# Patient Record
Sex: Female | Born: 1970 | Race: White | Hispanic: No | Marital: Married | Smoking: Never smoker
Health system: Southern US, Community
[De-identification: ages and names within clinical notes are randomized; demographics above are authoritative.]

---

## 2007-06-07 ENCOUNTER — Ambulatory Visit: Payer: Self-pay | Admitting: Pediatrics

## 2011-10-14 DIAGNOSIS — C419 Malignant neoplasm of bone and articular cartilage, unspecified: Secondary | ICD-10-CM | POA: Insufficient documentation

## 2011-10-14 DIAGNOSIS — N189 Chronic kidney disease, unspecified: Secondary | ICD-10-CM | POA: Insufficient documentation

## 2014-07-08 DIAGNOSIS — B009 Herpesviral infection, unspecified: Secondary | ICD-10-CM | POA: Insufficient documentation

## 2014-07-08 DIAGNOSIS — Z0000 Encounter for general adult medical examination without abnormal findings: Secondary | ICD-10-CM | POA: Insufficient documentation

## 2016-03-22 DIAGNOSIS — R29898 Other symptoms and signs involving the musculoskeletal system: Secondary | ICD-10-CM | POA: Insufficient documentation

## 2019-01-09 DIAGNOSIS — F4329 Adjustment disorder with other symptoms: Secondary | ICD-10-CM | POA: Insufficient documentation

## 2019-01-09 DIAGNOSIS — N951 Menopausal and female climacteric states: Secondary | ICD-10-CM | POA: Insufficient documentation

## 2019-04-13 DIAGNOSIS — E876 Hypokalemia: Secondary | ICD-10-CM | POA: Insufficient documentation

## 2019-04-13 DIAGNOSIS — N1832 Chronic kidney disease, stage 3b: Secondary | ICD-10-CM | POA: Insufficient documentation

## 2019-04-13 DIAGNOSIS — E612 Magnesium deficiency: Secondary | ICD-10-CM | POA: Insufficient documentation

## 2020-02-09 ENCOUNTER — Ambulatory Visit: Payer: Self-pay | Attending: Internal Medicine

## 2020-02-09 DIAGNOSIS — Z23 Encounter for immunization: Secondary | ICD-10-CM

## 2020-02-09 NOTE — Progress Notes (Signed)
   Covid-19 Vaccination Clinic  Name:  Candace Ray    MRN: [id number] DOB: 1971/07/08  02/09/2020  Candace Ray was observed post Covid-19 immunization for 15 minutes without incidence. She was provided with Vaccine Information Sheet and instruction to access the V-Safe system.   Candace Ray was instructed to call 911 with any severe reactions post vaccine: Marland Kitchen Difficulty breathing  . Swelling of your face and throat  . A fast heartbeat  . A bad rash all over your body  . Dizziness and weakness    Immunizations Administered    Name Date Dose VIS Date Route   Moderna COVID-19 Vaccine 02/09/2020 11:59 AM 0.5 mL 11/13/2019 Intramuscular   Manufacturer: Moderna   Lot: [id number]   [id number]

## 2020-03-08 ENCOUNTER — Ambulatory Visit: Payer: Self-pay | Attending: Internal Medicine

## 2020-03-08 DIAGNOSIS — Z23 Encounter for immunization: Secondary | ICD-10-CM

## 2020-03-08 NOTE — Progress Notes (Signed)
   Covid-19 Vaccination Clinic  Name:  Candace Ray    MRN: [id number] DOB: 1971-02-05  03/08/2020  [name] was observed post Covid-19 immunization for 15 minutes without incident. She was provided with Vaccine Information Sheet and instruction to access the V-Safe system.   [name] was instructed to call 911 with any severe reactions post vaccine: Marland Kitchen Difficulty breathing  . Swelling of face and throat  . A fast heartbeat  . A bad rash all over body  . Dizziness and weakness   Immunizations Administered    Name Date Dose VIS Date Route   Moderna COVID-19 Vaccine 03/08/2020 11:02 AM 0.5 mL 11/13/2019 Intramuscular   Manufacturer: Levan Hurst   LotUD:[id number]   [place]:[id number]

## 2020-11-01 ENCOUNTER — Other Ambulatory Visit: Payer: Self-pay

## 2020-11-01 ENCOUNTER — Ambulatory Visit
Admission: EM | Admit: 2020-11-01 | Discharge: 2020-11-01 | Disposition: A | Discharge status: Home / Self Care | Payer: BC Managed Care – PPO

## 2020-11-01 ENCOUNTER — Encounter: Payer: Self-pay | Admitting: Emergency Medicine

## 2020-11-01 DIAGNOSIS — H6981 Other specified disorders of Eustachian tube, right ear: Secondary | ICD-10-CM

## 2020-11-01 NOTE — ED Provider Notes (Signed)
[facility]    CSN: [id number] Arrival date & time: 11/01/20  1424      History   Chief Complaint Chief Complaint  Patient presents with   Otalgia    right   Dizziness    HPI Candace Ray is a 49 y.o. female.   HPI   49 year old female here for evaluation of right ear dull aching pain  Patient reports that she has had a dull ache in her right ear off and on for the past 2 weeks.  This morning when she woke up she had a bout of dizziness.  She says her dizziness is better when she is walking or moving through space and worse when she is sitting or laying.  Patient's also had a minor runny nose.  Patient denies fever, changes in hearing, sore throat, or discharge from her ear.  History reviewed. No pertinent past medical history.  There are no problems to display for this patient.   History reviewed. No pertinent surgical history.  OB History   No obstetric history on file.      Home Medications    Prior to Admission medications   Medication Sig Start Date End Date Taking? Authorizing Provider  LARISSIA 0.1-20 MG-MCG tablet Take 1 tablet by mouth daily. 09/25/20  Yes [provider]  losartan (COZAAR) 100 MG tablet losartan 100 mg tablet  TAKE 1 TABLET BY MOUTH EACH DAY 04/13/19  Yes [provider]  potassium chloride SA (KLOR-CON M20) 20 MEQ tablet Klor-Con M20 mEq tablet,extended release  TAKE 1 TABLET BY MOUTH DAILY. DO NOT CRUSH OR CHEW. 09/29/11  Yes [provider]    Family History History reviewed. No pertinent family history.  Social History Social History   Tobacco Use   Smoking status: Never Smoker   Smokeless tobacco: Never Used  Scientific laboratory technician Use: Never used  Substance Use Topics   Alcohol use: Yes   Drug use: Never     Allergies   Sulfa antibiotics   Review of Systems Review of Systems  Constitutional: Negative for activity change, appetite change and fever.  HENT: Positive  for ear pain and rhinorrhea. Negative for congestion, ear discharge, sinus pressure and sore throat.   Respiratory: Negative for cough and shortness of breath.   Cardiovascular: Negative for chest pain.  Gastrointestinal: Negative for nausea and vomiting.  Musculoskeletal: Negative for back pain.  Skin: Negative for rash.  Neurological: Positive for dizziness. Negative for headaches.  Hematological: Negative.   Psychiatric/Behavioral: Negative.      Physical Exam Triage Vital Signs ED Triage Vitals  Enc Vitals Group     BP 11/01/20 1445 119/87     Pulse Rate 11/01/20 1445 79     Resp 11/01/20 1445 14     Temp 11/01/20 1445 98.1 F (36.7 C)     Temp Source 11/01/20 1445 Oral     SpO2 11/01/20 1445 100 %     Weight 11/01/20 1441 140 lb (63.5 kg)     Height 11/01/20 1441 '5\' 6"'  (1.676 m)     Head Circumference --      Peak Flow --      Pain Score 11/01/20 1441 3     Pain Loc --      Pain Edu? --      Excl. in [place]? --    No data found.  Updated Vital Signs BP 119/87 (BP Location: Left Arm)    Pulse 79    Temp  98.1 F (36.7 C) (Oral)    Resp 14    Ht '5\' 6"'  (1.676 m)    Wt 140 lb (63.5 kg)    SpO2 100%    BMI 22.60 kg/m   Visual Acuity Right Eye Distance:   Left Eye Distance:   Bilateral Distance:    Right Eye Near:   Left Eye Near:    Bilateral Near:     Physical Exam Vitals and nursing note reviewed.  Constitutional:      General: She is not in acute distress.    Appearance: Normal appearance. She is not toxic-appearing.  HENT:     Head: Normocephalic and atraumatic.     Right Ear: Tympanic membrane, ear canal and external ear normal. There is no impacted cerumen.     Left Ear: Tympanic membrane, ear canal and external ear normal. There is no impacted cerumen.     Nose: Congestion and rhinorrhea present.     Comments: The mucosa is mildly erythematous and edematous with clear nasal discharge.    Mouth/Throat:     Mouth: Mucous membranes are moist.     Pharynx:  Oropharynx is clear. No oropharyngeal exudate or posterior oropharyngeal erythema.  Eyes:     General: No scleral icterus.    Extraocular Movements: Extraocular movements intact.     Conjunctiva/sclera: Conjunctivae normal.     Pupils: Pupils are equal, round, and reactive to light.  Cardiovascular:     Rate and Rhythm: Normal rate and regular rhythm.     Pulses: Normal pulses.     Heart sounds: Normal heart sounds. No murmur heard.  No gallop.   Pulmonary:     Effort: Pulmonary effort is normal.     Breath sounds: Normal breath sounds. No wheezing, rhonchi or rales.  Musculoskeletal:        General: No swelling or tenderness. Normal range of motion.  Skin:    General: Skin is warm and dry.     Capillary Refill: Capillary refill takes less than 2 seconds.     Findings: No erythema or rash.  Neurological:     General: No focal deficit present.     Mental Status: She is alert and oriented to person, place, and time.  Psychiatric:        Mood and Affect: Mood normal.        Behavior: Behavior normal.        Thought Content: Thought content normal.        Judgment: Judgment normal.      UC Treatments / Results  Labs (all labs ordered are listed, but only abnormal results are displayed) Labs Reviewed - No data to display  EKG   Radiology No results found.  Procedures Procedures (including critical care time)  Medications Ordered in UC Medications - No data to display  Initial Impression / Assessment and Plan / UC Course  I have reviewed the triage vital signs and the nursing notes.  Pertinent labs & imaging results that were available during my care of the patient were reviewed by me and considered in my medical decision making (see chart for details).   Is here for evaluation of a dull right earache that she has had off-and-on for the past 2 weeks.  Patient denies discharge or fever from her ear.  Physical exam reveals normal tympanic membranes bilaterally.  Patient  does have tenderness to the eustachian tube on the right side.  Patient coached through equalizing her ears in the exam room  and expressed some relief of symptoms on the right.  Patient's exam consistent with eustachian tube dysfunction.  Will DC home with instructions for sinus irrigation using a NeilMed sinus rinse kit and distilled water, Flonase, and continued equalization of her tympanic membrane's.   Final Clinical Impressions(s) / UC Diagnoses   Final diagnoses:  Eustachian tube dysfunction, right     Discharge Instructions     Perform sinus irrigation with a NeilMed sinus rinse kit and distilled water twice daily.  Use Flonase, 2 squirts in each nostril at bedtime, to help with eustachian tube inflammation and cut down on mucus production.  After each pair of squirts per nostril follow-up with one squirt of nasal saline spray.  Continue to equalize her ears until her symptoms resolve.  Return or follow-up with your primary care provider with continued or worsening symptoms.    ED Prescriptions    None     PDMP not reviewed this encounter.   Margarette Canada, NP 11/01/20 1525

## 2020-11-01 NOTE — Discharge Instructions (Addendum)
Perform sinus irrigation with a NeilMed sinus rinse kit and distilled water twice daily.  Use Flonase, 2 squirts in each nostril at bedtime, to help with eustachian tube inflammation and cut down on mucus production.  After each pair of squirts per nostril follow-up with one squirt of nasal saline spray.  Continue to equalize her ears until her symptoms resolve.  Return or follow-up with your primary care provider with continued or worsening symptoms.

## 2020-11-01 NOTE — ED Triage Notes (Signed)
Patient c/o right ear pain off and on for couple of weeks.  Patient states that when she woke up this morning she had an episode of dizziness.  Patient states that has resolved.  Patient denies fevers.

## 2020-12-16 ENCOUNTER — Ambulatory Visit
Admission: EM | Admit: 2020-12-16 | Discharge: 2020-12-16 | Disposition: A | Discharge status: Home / Self Care | Payer: BC Managed Care – PPO

## 2020-12-17 ENCOUNTER — Other Ambulatory Visit: Payer: Self-pay

## 2020-12-17 ENCOUNTER — Ambulatory Visit
Admission: RE | Admit: 2020-12-17 | Discharge: 2020-12-17 | Disposition: A | Discharge status: Home / Self Care | Payer: Self-pay | Source: Ambulatory Visit | Attending: Sports Medicine | Admitting: Sports Medicine

## 2020-12-17 ENCOUNTER — Ambulatory Visit (INDEPENDENT_AMBULATORY_CARE_PROVIDER_SITE_OTHER): Payer: Self-pay

## 2020-12-17 VITALS — BP 143/100 | HR 92 | Temp 98.0°F | Resp 18 | Ht 65.0 in | Wt 140.0 lb

## 2020-12-17 DIAGNOSIS — S6981XA Other specified injuries of right wrist, hand and finger(s), initial encounter: Secondary | ICD-10-CM

## 2020-12-17 DIAGNOSIS — M25531 Pain in right wrist: Secondary | ICD-10-CM

## 2020-12-17 NOTE — ED Provider Notes (Signed)
[facility]    CSN: [id number] Arrival date & time: 12/17/20  1632      History   Chief Complaint Chief Complaint  Patient presents with  . Appointment    [phone]  . Wrist Pain    right    HPI Candace Ray is a 50 y.o. female.   Patient is a pleasant 50 year old right-hand-dominant female who presents for evaluation about 3 weeks of right wrist dorsal pain. She denies any accidents trauma or falls. It began shortly after she was doing some blowing of the leaves on her property. No swelling bruising redness. She been using Tylenol icing and using a brace with limited success. She says is not getting worse but is not really improving. She points over the distal aspect of the ulna as the point of maximal tenderness. Denies any neck pain numbness tingling. Not dropping any objects. No red flag signs or symptoms listed on history. She has never had any symptoms like this in the past. 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     History reviewed. No pertinent past medical history.  There are no problems to display for this patient.   History reviewed. No pertinent surgical history.  OB History   No obstetric history on file.      Home Medications    Prior to Admission medications   Medication Sig Start Date End Date Taking? Authorizing Provider  LARISSIA 0.1-20 MG-MCG tablet Take 1 tablet by mouth daily. 09/25/20  Yes [provider]  losartan (COZAAR) 100 MG tablet losartan 100 mg tablet  TAKE 1 TABLET BY MOUTH EACH DAY 04/13/19  Yes [provider]  potassium chloride SA (KLOR-CON) 20 MEQ tablet Klor-Con M20 mEq tablet,extended release  TAKE 1 TABLET BY MOUTH DAILY. DO NOT CRUSH OR CHEW. 09/29/11  Yes [provider]    Family History Family History  Problem Relation Age of Onset  . Healthy Mother     Social History Social History   Tobacco Use  . Smoking status: Never Smoker  . Smokeless tobacco:  Never Used  Vaping Use  . Vaping Use: Never used  Substance Use Topics  . Alcohol use: Yes  . Drug use: Never     Allergies   Sulfa antibiotics   Review of Systems Review of Systems  Constitutional: Negative.   HENT: Negative.   Eyes: Negative.   Respiratory: Negative.   Cardiovascular: Negative.   Gastrointestinal: Negative.   Genitourinary: Negative.   Musculoskeletal: Negative for neck pain and neck stiffness.       Positive for right wrist pain  Skin: Negative for color change, pallor, rash and wound.  Neurological: Negative for weakness and numbness.  All other systems reviewed and are negative.    Physical Exam Triage Vital Signs ED Triage Vitals  Enc Vitals Group     BP 12/17/20 1906 (!) 143/100     Pulse Rate 12/17/20 1906 92     Resp 12/17/20 1906 18     Temp 12/17/20 1906 98 F (36.7 C)     Temp Source 12/17/20 1906 Oral     SpO2 12/17/20 1906 100 %     Weight 12/17/20 1754 140 lb (63.5 kg)     Height 12/17/20 1754 5\' 5"  (1.651 m)     Head Circumference --      Peak Flow --      Pain Score 12/17/20 1753 3     Pain Loc --  Pain Edu? --      Excl. in [place]? --    No data found.  Updated Vital Signs BP (!) 143/100 (BP Location: Right Arm)   Pulse 92   Temp 98 F (36.7 C) (Oral)   Resp 18   Ht 5\' 5"  (1.651 m)   Wt 63.5 kg   SpO2 100%   BMI 23.30 kg/m   Visual Acuity Right Eye Distance:   Left Eye Distance:   Bilateral Distance:    Right Eye Near:   Left Eye Near:    Bilateral Near:     Physical Exam Vitals and nursing note reviewed.  Constitutional:      General: She is not in acute distress.    Appearance: Normal appearance. She is not ill-appearing or toxic-appearing.  HENT:     Head: Normocephalic and atraumatic.  Musculoskeletal:     Comments: Left wrist: Normal to inspection palpation range of motion special test.  Right wrist: No obvious bony abnormality ecchymosis erythema soft tissue swelling. She is tender to palpation  over the distal aspect of the ulna just medial to the ulnar styloid. Its directly over the TFCC. She has some mild decreased range of motion with ulnar radial deviation that localizes her pain in that area. Also some mild decreased range of motion with wrist extension. Grip strength is well-preserved. I do not appreciate any clicking throughout circumduction of the wrist. She has no real tenderness over the wrist joint itself. No tenderness in the snuffbox. Normal sensation 2+ pulses distally.  Neurological:     General: No focal deficit present.     Mental Status: She is alert and oriented to person, place, and time.      UC Treatments / Results  Labs (all labs ordered are listed, but only abnormal results are displayed) Labs Reviewed - No data to display  EKG   Radiology DG Wrist Complete Right  Result Date: 12/17/2020 CLINICAL DATA:  Wrist pain for 3 weeks EXAM: RIGHT WRIST - COMPLETE 3+ VIEW COMPARISON:  None. FINDINGS: There is no evidence of fracture or dislocation. There is no evidence of arthropathy or other focal bone abnormality. Soft tissues are unremarkable. IMPRESSION: Negative. Electronically Signed   By: 02/14/2021 M.D.   On: 12/17/2020 19:18    Procedures Procedures (including critical care time)  Medications Ordered in UC Medications - No data to display  Initial Impression / Assessment and Plan / UC Course  I have reviewed the triage vital signs and the nursing notes.  Pertinent labs & imaging results that were available during my care of the patient were reviewed by me and considered in my medical decision making (see chart for details).   Clinical impression: Right wrist pain localized over the distal ulna. Is consistent with a TFCC injury versus irritation from using her leaf blower. Certainly could have a tendinitis but seems more consistent with TFCC involvement.  Treatment plan: 1. The findings and treatment plan were discussed in detail with the  patient. Patient was in agreement. 2. X-rays were negative for any acute osseous findings. 3. Educational handout on TFCC injuries was provided. 4. Continue with the brace intermittently, icing and over-the-counter anti-inflammatories. 5. Recommended that she see a wrist specialist. She wanted to go to [facility]. We will give her the number for orthopedics in [facility]. 6. Supportive care, and were happy to see her back on an as-needed basis.     Final Clinical Impressions(s) / UC Diagnoses   Final  diagnoses:  Right wrist pain  TFCC (triangular fibrocartilage complex) injury, right, initial encounter     Discharge Instructions     X-rays were negative for any acute osseous findings. Educational handout on TFCC injuries was provided. Continue with the brace intermittently, icing and over-the-counter anti-inflammatories. Recommended that she see a wrist specialist. She wanted to go to [facility]. We will give her the number for orthopedics in [facility]. Supportive care, and were happy to see her back on an as-needed basis.    ED Prescriptions    None     PDMP not reviewed this encounter.   Verda Cumins, MD 12/17/20 2028

## 2020-12-17 NOTE — ED Triage Notes (Signed)
Patient complains of right wrist pain that started around 3 weeks ago. Patient states that this started after blowing leaves.

## 2020-12-17 NOTE — Discharge Instructions (Addendum)
X-rays were negative for any acute osseous findings. Educational handout on TFCC injuries was provided. Continue with the brace intermittently, icing and over-the-counter anti-inflammatories. Recommended that she see a wrist specialist. She wanted to go to [facility]. We will give her the number for orthopedics in [facility]. Supportive care, and were happy to see her back on an as-needed basis.

## 2021-07-18 ENCOUNTER — Encounter: Payer: Self-pay | Admitting: Emergency Medicine

## 2021-07-18 ENCOUNTER — Ambulatory Visit (INDEPENDENT_AMBULATORY_CARE_PROVIDER_SITE_OTHER): Payer: BC Managed Care – PPO

## 2021-07-18 ENCOUNTER — Ambulatory Visit
Admission: EM | Admit: 2021-07-18 | Discharge: 2021-07-18 | Disposition: A | Discharge status: Home / Self Care | Payer: BC Managed Care – PPO | Attending: Physician Assistant | Admitting: Physician Assistant

## 2021-07-18 DIAGNOSIS — M25572 Pain in left ankle and joints of left foot: Secondary | ICD-10-CM | POA: Diagnosis not present

## 2021-07-18 DIAGNOSIS — M79672 Pain in left foot: Secondary | ICD-10-CM

## 2021-07-18 DIAGNOSIS — S9032XA Contusion of left foot, initial encounter: Secondary | ICD-10-CM | POA: Diagnosis not present

## 2021-07-18 MED ORDER — IBUPROFEN 800 MG PO TABS: 800.0000 mg | ORAL_TABLET | Freq: Three times a day (TID) | ORAL | 0 refills | Status: AC | PRN

## 2021-07-18 NOTE — Discharge Instructions (Addendum)
FOOT PAIN: X-rays are not significant for any fractures. Stressed avoiding painful activities . Reviewed RICE guidelines. Use medications as directed, including NSAIDs. If no NSAIDs have been prescribed for you today, you may take Aleve or Motrin over the counter. May use Tylenol in between doses of NSAIDs.  If no improvement in the next 1-2 weeks, f/u with PCP or return to our office for reexamination, and please feel free to call or return at any time for any questions or concerns you may have and we will be happy to help you!

## 2021-07-18 NOTE — ED Provider Notes (Signed)
[facility]    CSN: [id number] Arrival date & time: 07/18/21  1207      History   Chief Complaint Chief Complaint  Patient presents with   Ankle Pain    HPI Candace Ray is a 50 y.o. female presenting for left ankle and foot pain and swelling as well as bruising following an injury she sustained 2 days ago.  Patient states that she fell over a tree root and believes she twisted her ankle.  She says she has been trying to elevate the extremity and ice the area and is also taken ibuprofen and Tylenol for discomfort.  She says it does not hurt too badly but she is concerned about swelling and bruising.  She has a history of bone cancer in the affected extremity and states she already has diminished feeling in this foot and foot drop.  Denies any change in her baseline in regards to the paresthesias or weakness.  No other injury sustained.  No other complaints.  HPI  History reviewed. No pertinent past medical history.  There are no problems to display for this patient.   History reviewed. No pertinent surgical history.  OB History   No obstetric history on file.      Home Medications    Prior to Admission medications   Medication Sig Start Date End Date Taking? Authorizing Provider  ibuprofen (ADVIL) 800 MG tablet Take 1 tablet (800 mg total) by mouth every 8 (eight) hours as needed for up to 7 days. 07/18/21 07/25/21 Yes Laurene Footman B, PA-C  LARISSIA 0.1-20 MG-MCG tablet Take 1 tablet by mouth daily. 09/25/20  Yes [provider]  losartan (COZAAR) 100 MG tablet losartan 100 mg tablet  TAKE 1 TABLET BY MOUTH EACH DAY 04/13/19  Yes [provider]  potassium chloride SA (KLOR-CON) 20 MEQ tablet Klor-Con M20 mEq tablet,extended release  TAKE 1 TABLET BY MOUTH DAILY. DO NOT CRUSH OR CHEW. 09/29/11  Yes [provider]    Family History Family History  Problem Relation Age of Onset   Healthy Mother     Social History Social  History   Tobacco Use   Smoking status: Never   Smokeless tobacco: Never  Vaping Use   Vaping Use: Never used  Substance Use Topics   Alcohol use: Yes   Drug use: Never     Allergies   Sulfa antibiotics   Review of Systems Review of Systems  Musculoskeletal:  Positive for arthralgias and joint swelling. Negative for gait problem.  Skin:  Positive for color change. Negative for wound.  Neurological:  Positive for numbness. Negative for weakness.    Physical Exam Triage Vital Signs ED Triage Vitals  Enc Vitals Group     BP 07/18/21 1251 126/89     Pulse Rate 07/18/21 1251 (!) 105     Resp 07/18/21 1251 16     Temp 07/18/21 1251 98.1 F (36.7 C)     Temp Source 07/18/21 1251 Oral     SpO2 07/18/21 1251 98 %     Weight --      Height --      Head Circumference --      Peak Flow --      Pain Score 07/18/21 1250 1     Pain Loc --      Pain Edu? --      Excl. in [place]? --    No data found.  Updated Vital Signs BP 126/89 (BP Location: Left Arm)  Pulse (!) 105   Temp 98.1 F (36.7 C) (Oral)   Resp 16   SpO2 98%       Physical Exam Vitals and nursing note reviewed.  Constitutional:      General: She is not in acute distress.    Appearance: Normal appearance. She is not ill-appearing or toxic-appearing.  HENT:     Head: Normocephalic and atraumatic.  Eyes:     General: No scleral icterus.       Right eye: No discharge.        Left eye: No discharge.     Conjunctiva/sclera: Conjunctivae normal.  Cardiovascular:     Rate and Rhythm: Normal rate and regular rhythm.     Pulses: Normal pulses.  Pulmonary:     Effort: Pulmonary effort is normal. No respiratory distress.  Musculoskeletal:     Cervical back: Neck supple.     Left ankle: Swelling present. Tenderness present over the lateral malleolus, medial malleolus and ATF ligament. Decreased range of motion.     Left foot: Swelling and tenderness (TTP over 3rd-5th metatarsals) present. Normal pulse.      Comments: Significant swelling and ecchymosis of foot and ankle  Skin:    General: Skin is dry.  Neurological:     General: No focal deficit present.     Mental Status: She is alert. Mental status is at baseline.     Motor: No weakness.     Gait: Gait abnormal.  Psychiatric:        Mood and Affect: Mood normal.        Behavior: Behavior normal.        Thought Content: Thought content normal.     UC Treatments / Results  Labs (all labs ordered are listed, but only abnormal results are displayed) Labs Reviewed - No data to display  EKG   Radiology DG Ankle Complete Left  Result Date: 07/18/2021 CLINICAL DATA:  Fall, left ankle pain EXAM: LEFT ANKLE COMPLETE - 3+ VIEW COMPARISON:  None. FINDINGS: Three view radiograph left ankle demonstrates normal alignment. No fracture or dislocation. Ankle mortise is aligned. No effusion. Mild soft tissue swelling noted superficial to the lateral malleolus. IMPRESSION: Lateral soft tissue swelling.  No fracture or dislocation. Electronically Signed   By: Fidela Salisbury MD   On: 07/18/2021 13:25   DG Foot Complete Left  Result Date: 07/18/2021 CLINICAL DATA:  Fall, left foot pain EXAM: LEFT FOOT - COMPLETE 3+ VIEW COMPARISON:  None. FINDINGS: Normal alignment. No fracture or dislocation. Advanced degenerate arthritis of the first MTP joint with large subchondral cyst or possible surgical defect within the head of the first metatarsal. Remaining joint spaces are preserved. Moderate soft tissue swelling of the left forefoot. IMPRESSION: Soft tissue swelling of the left forefoot. No fracture or dislocation. 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 Electronically Signed   By: Fidela Salisbury MD   On: 07/18/2021 13:27    Procedures Procedures (including critical care time)  Medications Ordered in UC Medications - No data to display  Initial Impression /  Assessment and Plan / UC Course  I have reviewed the triage vital signs and the nursing notes.  Pertinent labs & imaging results that were available during my care of the patient were reviewed by me and considered in my medical decision making (see chart for details).  50 year old female presenting for left foot/ankle pain, swelling and bruising.  X-ray  of foot and ankle obtained today and independently viewed by me.  X-rays negative for any fractures.  X-ray does show advanced degenerative arthritis of the left first MTP joint and postsurgical changes of the left first metatarsal head.  Reviewed results with patient.  Advised her that her x-rays are clear for any fractures.  Advised her that the pain is due to the swelling and bruising.  Reviewed RICE guidelines.  Sent in ibuprofen 800 mg tablets.  Also placed patient in lace up ankle brace.  Advised her to follow-up with PCP or Ortho as needed if not improving over the next 7 to 10 days or symptoms worsen.  Final Clinical Impressions(s) / UC Diagnoses   Final diagnoses:  Acute left ankle pain  Left foot pain  Contusion of left foot, initial encounter     Discharge Instructions      FOOT PAIN: X-rays are not significant for any fractures. Stressed avoiding painful activities . Reviewed RICE guidelines. Use medications as directed, including NSAIDs. If no NSAIDs have been prescribed for you today, you may take Aleve or Motrin over the counter. May use Tylenol in between doses of NSAIDs.  If no improvement in the next 1-2 weeks, f/u with PCP or return to our office for reexamination, and please feel free to call or return at any time for any questions or concerns you may have and we will be happy to help you!         ED Prescriptions     Medication Sig Dispense Auth. Provider   ibuprofen (ADVIL) 800 MG tablet Take 1 tablet (800 mg total) by mouth every 8 (eight) hours as needed for up to 7 days. 21 tablet Danton Clap, PA-C       PDMP not reviewed this encounter.   Danton Clap, PA-C 07/18/21 1357

## 2021-07-18 NOTE — ED Triage Notes (Signed)
Pt c/o fall over a tree root. She states her left ankle is swollen and is painful when bearing weight. She has been using tylenol, ice and elevating her leg as much as possible with relief. Pt states she has a hx of bone cancer in that leg.

## 2021-11-17 ENCOUNTER — Ambulatory Visit: Payer: BC Managed Care – PPO | Admitting: Podiatry

## 2021-11-17 ENCOUNTER — Other Ambulatory Visit: Payer: Self-pay

## 2021-11-17 DIAGNOSIS — M7752 Other enthesopathy of left foot: Secondary | ICD-10-CM | POA: Diagnosis not present

## 2021-11-24 ENCOUNTER — Encounter: Payer: Self-pay | Admitting: Podiatry

## 2021-11-24 NOTE — Progress Notes (Signed)
Subjective:  Patient ID: Candace Ray, female    DOB: Jan 30, 1971,  MRN: [id number]  Chief Complaint  Patient presents with   Foot Pain    50 y.o. female presents with the above complaint.  Patient presents with complaint of left third metatarsophalangeal joint pain.  Patient states it Painful has progressed to gotten worse.  Is tender to touch tender with ambulation.  She states it hurts to going barefoot floor.  She states it hurts to stand on it.  She would like to discuss treatment options she has not seen anyone as prior to see me.  Pain scale is 8 out of 10 sharp shooting with sometimes dull aching nature to it.  She has tried some over-the-counter stuff none of which has helped.  She has not made any shoe gear modification either.   Review of Systems: Negative except as noted in the HPI. Denies N/V/F/Ch.  No past medical history on file.  Current Outpatient Medications:    ibuprofen (ADVIL) 600 MG tablet, Take by mouth., Disp: , Rfl:    losartan (COZAAR) 100 MG tablet, Take by mouth., Disp: , Rfl:    meclizine (ANTIVERT) 12.5 MG tablet, Take by mouth., Disp: , Rfl:    valACYclovir (VALTREX) 500 MG tablet, valacyclovir 500 mg tablet  TAKE 1 TABLET BY MOUTH EVERY DAY - PLEASE SCHEDULE ANNUAL EXAM, Disp: , Rfl:    LARISSIA 0.1-20 MG-MCG tablet, Take 1 tablet by mouth daily., Disp: , Rfl:    levonorgestrel-ethinyl estradiol (ALESSE) 0.1-20 MG-MCG tablet, Larissia 0.1 mg-20 mcg tablet, Disp: , Rfl:    losartan (COZAAR) 100 MG tablet, losartan 100 mg tablet  TAKE 1 TABLET BY MOUTH EACH DAY, Disp: , Rfl:    magnesium oxide (MAG-OX) 400 MG tablet, magnesium oxide 400 mg (241.3 mg magnesium) tablet  TAKE 1 TABLET BY MOUTH DAILY, Disp: , Rfl:    potassium chloride SA (KLOR-CON) 20 MEQ tablet, Klor-Con M20 mEq tablet,extended release  TAKE 1 TABLET BY MOUTH DAILY. DO NOT CRUSH OR CHEW., Disp: , Rfl:    QUICKVUE AT-HOME COVID-19 TEST KIT, , Disp: , Rfl:    rosuvastatin (CRESTOR) 10 MG  tablet, Take 10 mg by mouth at bedtime., Disp: , Rfl:   Social History   Tobacco Use  Smoking Status Never  Smokeless Tobacco Never    Allergies  Allergen Reactions   Sulfa Antibiotics Other (See Comments)    Other reaction(s): NAUSEA Flu-like symptoms Other reaction(s): NAUSEA    Objective:  There were no vitals filed for this visit. There is no height or weight on file to calculate BMI. Constitutional Well developed. Well nourished.  Vascular Dorsalis pedis pulses palpable bilaterally. Posterior tibial pulses palpable bilaterally. Capillary refill normal to all digits.  No cyanosis or clubbing noted. Pedal hair growth normal.  Neurologic Normal speech. Oriented to person, place, and time. Epicritic sensation to light touch grossly present bilaterally.  Dermatologic Nails well groomed and normal in appearance. No open wounds. No skin lesions.  Orthopedic: Pain on palpation left third metatarsal phalangeal joint.  Pain with range of motion of the joint.  Pain on palpation to the joint.  No extensor or flexor tendinitis noted.  Negative Keli can push-up stress test.  No hammertoe contracture noted.   Radiographs: None Assessment:   1. Capsulitis of metatarsophalangeal (MTP) joint of left foot    Plan:  Patient was evaluated and treated and all questions answered.  Left MTP third capsulitis -I explained to the patient the etiology of capsulitis  and various treatment options were extensively discussed.  Given the amount of pain that she is having I believe she will benefit from a steroid injection help decrease acute inflammatory component associated with pain.  Patient agrees with plan like to proceed with steroid injection. -A steroid injection was performed at left third metatarsophalangeal joint using 1% plain Lidocaine and 10 mg of Kenalog. This was well tolerated. -We will plan on getting x-ray if there is no resolve meant to next clinical visit -She may even benefit  from orthotics versus cam boot immobilization during next clinical visit   No follow-ups on file.

## 2021-12-31 ENCOUNTER — Ambulatory Visit: Payer: BC Managed Care – PPO | Admitting: Podiatry

## 2022-01-14 IMAGING — CR DG ANKLE COMPLETE 3+V*L*
3 series · 3 of 3 positions shown · non-contrast
Comparison: None.

CLINICAL DATA: Fall, left ankle pain

EXAM:
LEFT ANKLE COMPLETE - 3+ VIEW

[ankle ap]
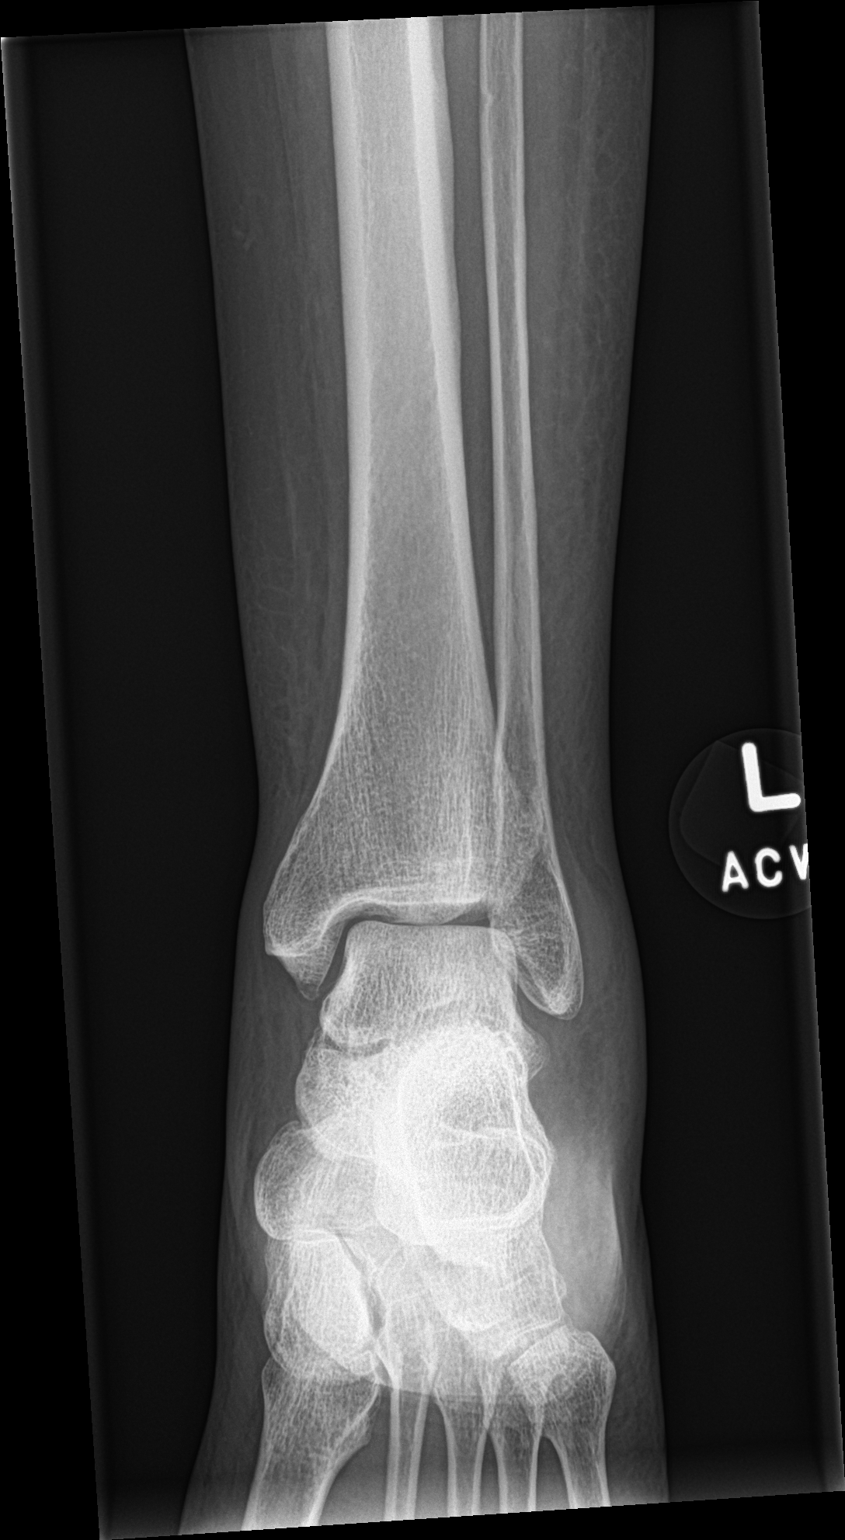

[ankle obl]
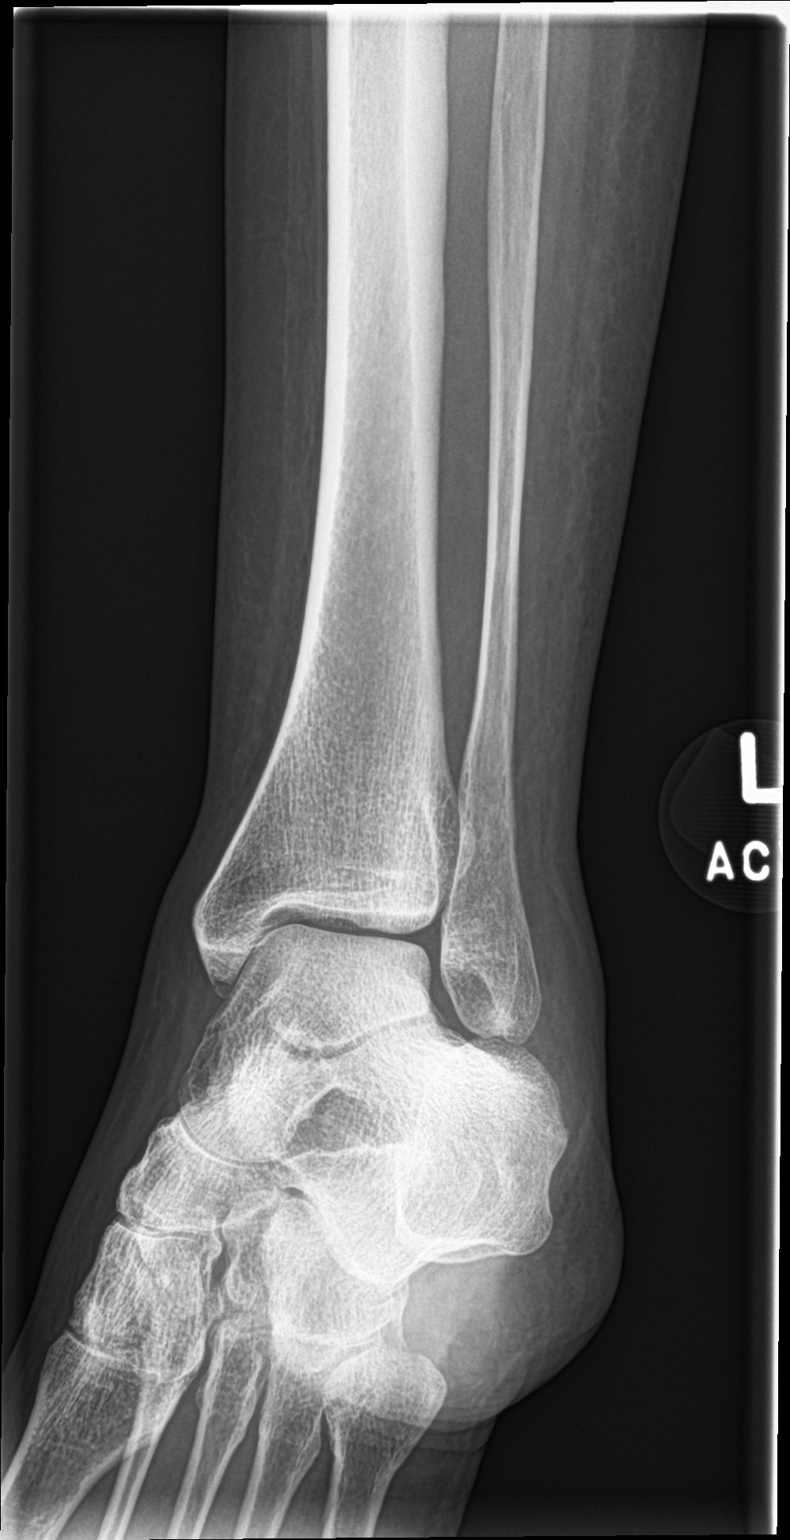

[ankle lat]
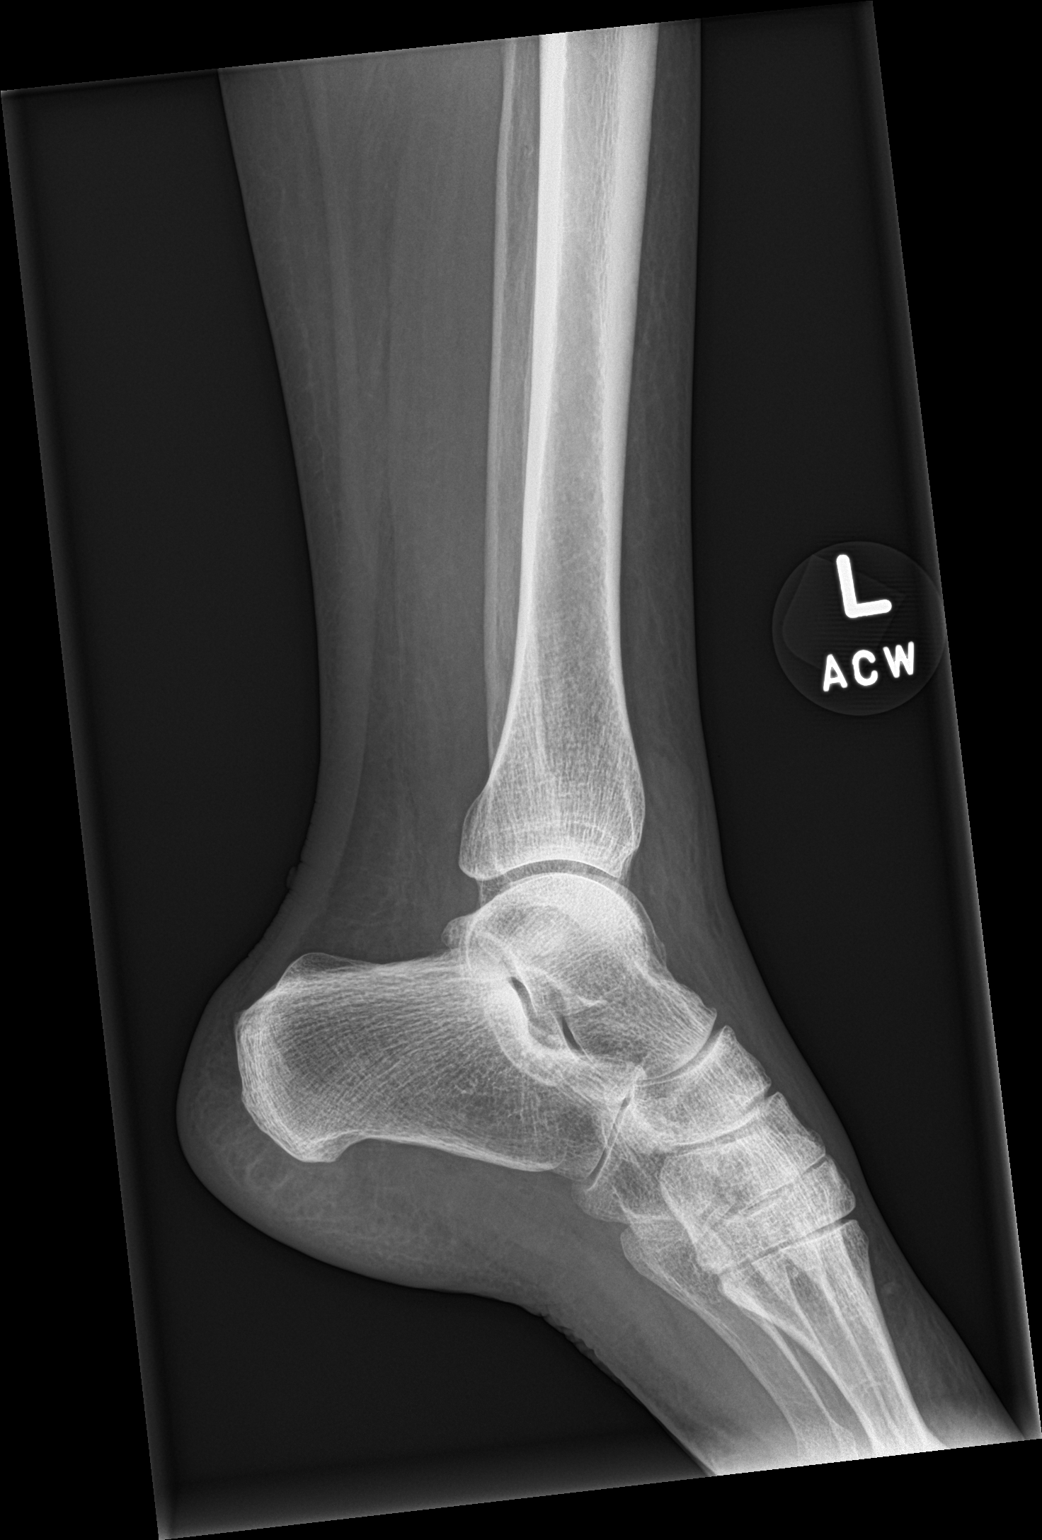

[3 of 3 positions shown; findings below may reference images not displayed]

FINDINGS: Three view radiograph left ankle demonstrates normal alignment. No
fracture or dislocation. Ankle mortise is aligned. No effusion. Mild
soft tissue swelling noted superficial to the lateral malleolus.
IMPRESSION: Lateral soft tissue swelling.  No fracture or dislocation.

## 2022-01-14 IMAGING — CR DG FOOT COMPLETE 3+V*L*
3 series · 3 of 3 positions shown · non-contrast
Comparison: None.

CLINICAL DATA: Fall, left foot pain

EXAM:
LEFT FOOT - COMPLETE 3+ VIEW

[foot ap]
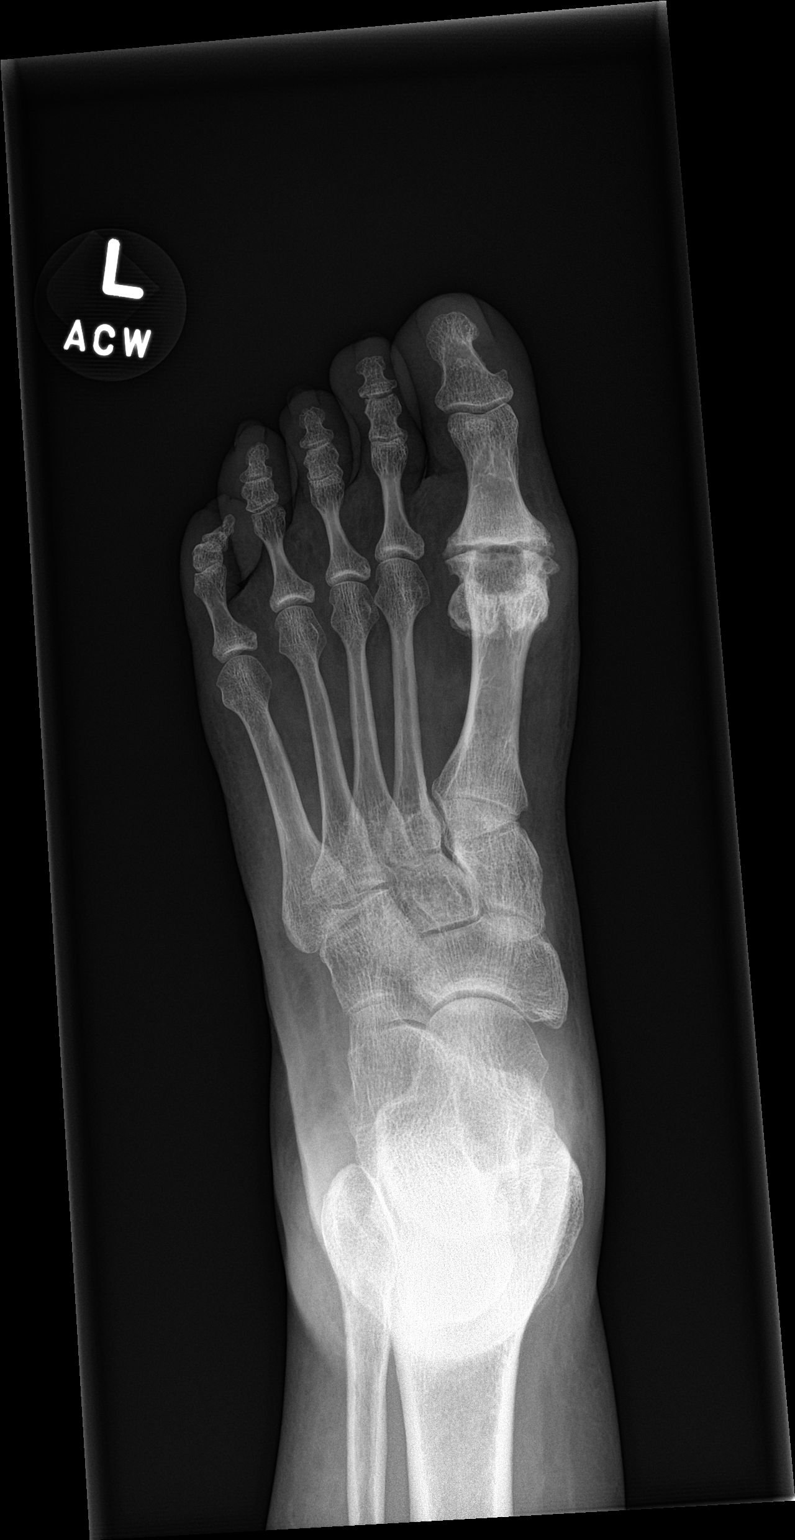

[foot obl]
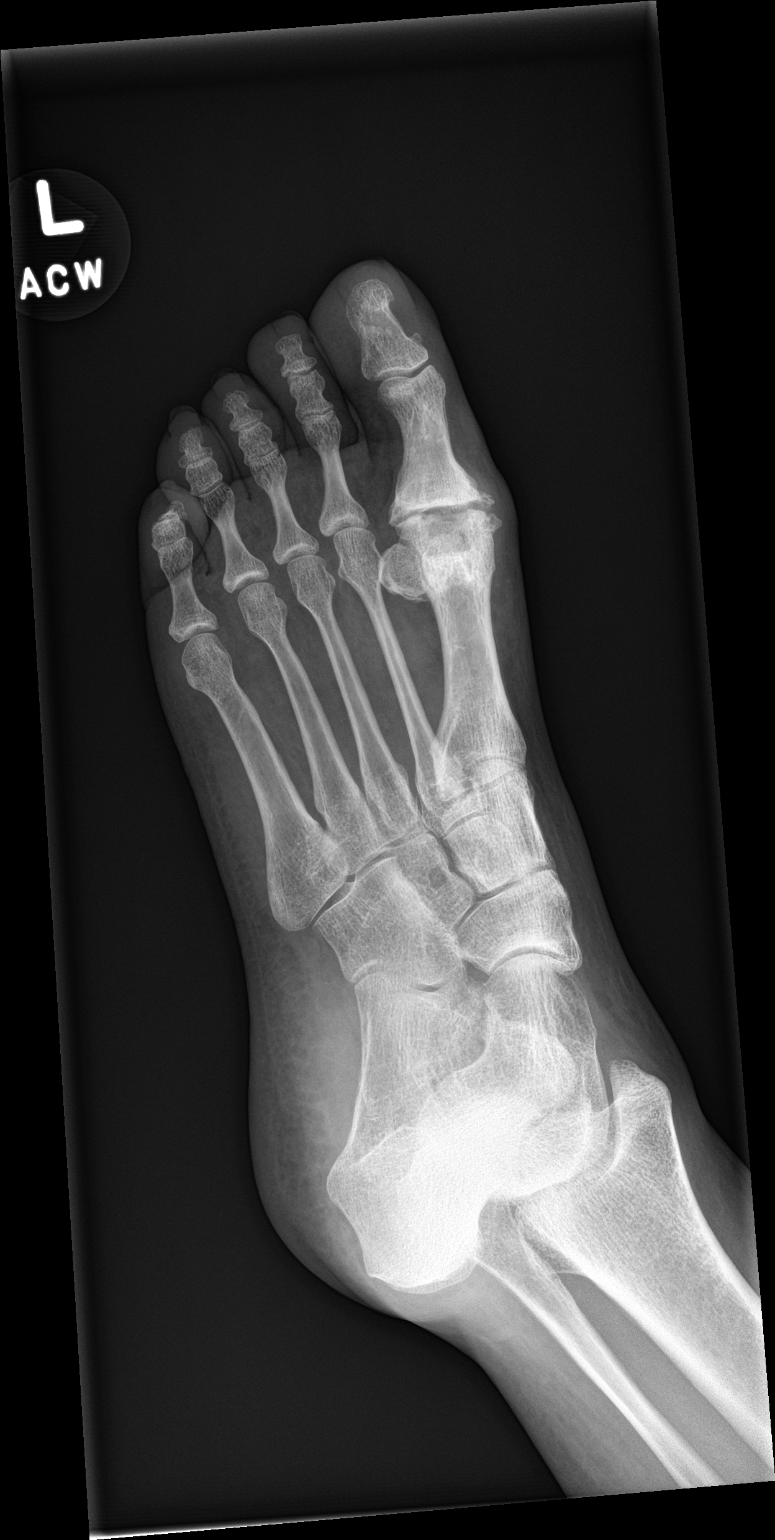

[foot lat]
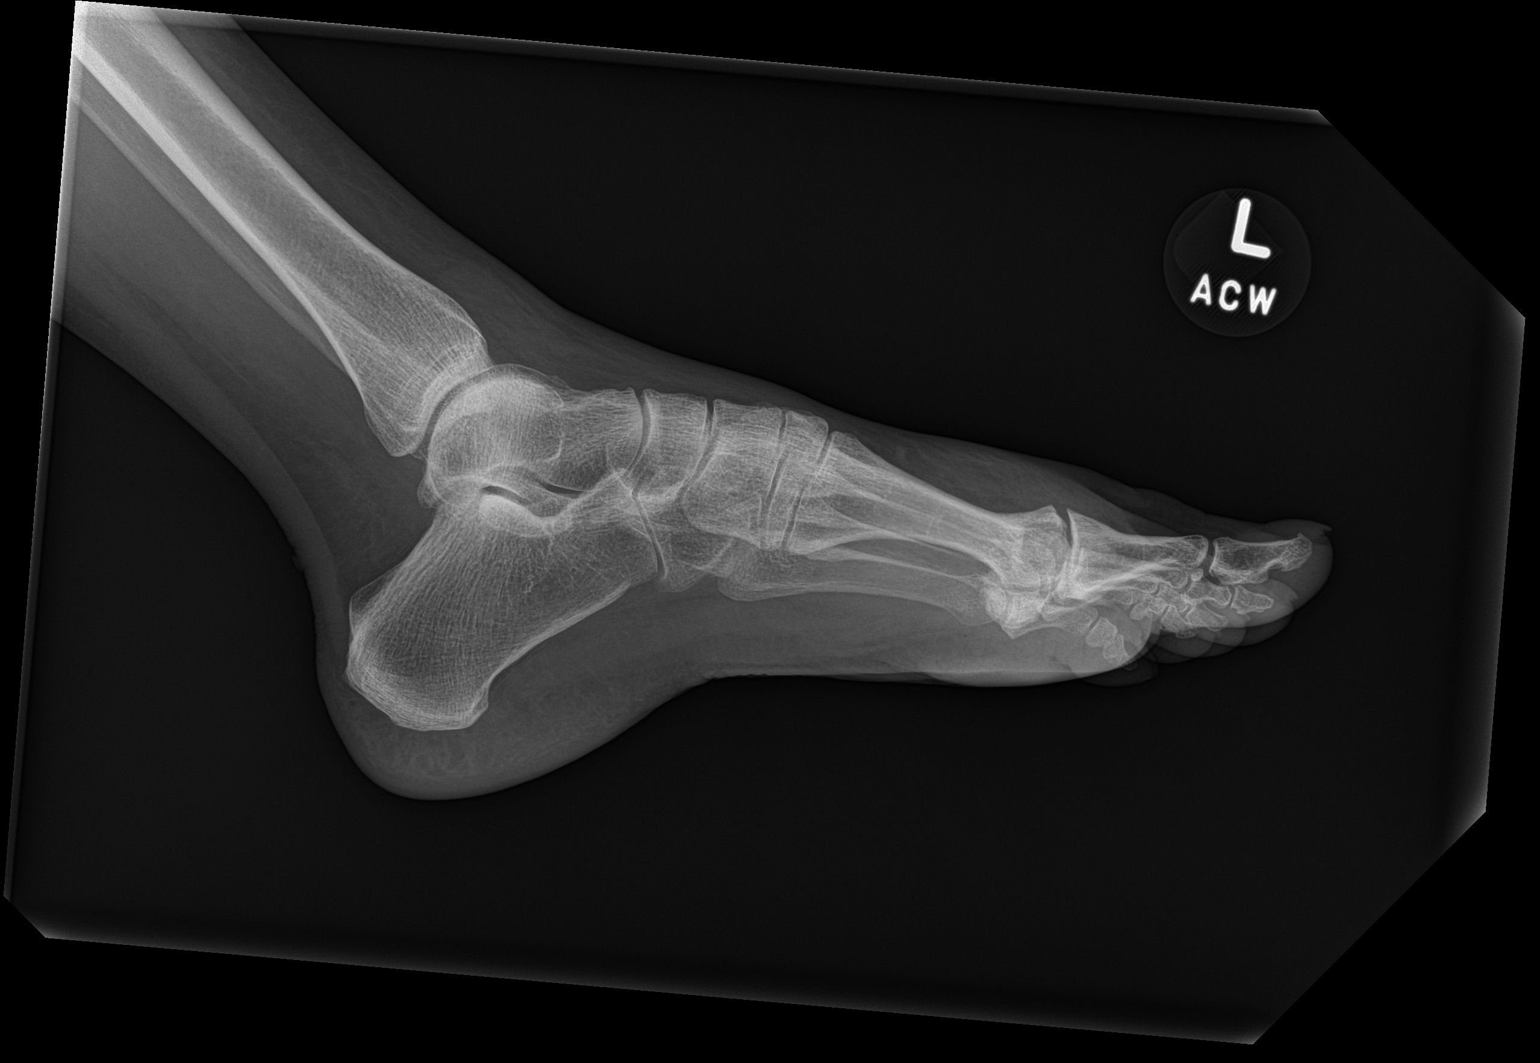

[3 of 3 positions shown; findings below may reference images not displayed]

FINDINGS: Normal alignment. No fracture or dislocation. Advanced degenerate
arthritis of the first MTP joint with large subchondral cyst or
possible surgical defect within the head of the first metatarsal.
Remaining joint spaces are preserved. Moderate soft tissue swelling
of the left forefoot.
IMPRESSION: Soft tissue swelling of the left forefoot. No fracture or
dislocation.

Advanced degenerative arthritis of the left first MTP joint with
possible postsurgical changes within the left first metatarsal head.
